# Patient Record
Sex: Female | Born: 2000 | Race: White | Hispanic: No | Marital: Single | Smoking: Never smoker
Health system: Southern US, Community
[De-identification: ages and names within clinical notes are randomized; demographics above are authoritative.]

## PROBLEM LIST (undated history)

## (undated) HISTORY — PX: WISDOM TOOTH EXTRACTION: SHX21

---
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
## 2022-01-23 IMAGING — MR MR FOOT*R* W/O CM
4 of 5 series · 13 of 40 positions shown · non-contrast
Comparison: Foot radiographs 11/05/2019

CLINICAL DATA: Chronic heel pain for 4 months. No known injury or
prior relevant surgery. Plantar fasciitis suspected.

EXAM:
MRI OF THE RIGHT HINDFOOT WITHOUT CONTRAST
TECHNIQUE: Multiplanar, multisequence MR imaging of the right ankle and
hindfoot was performed. No intravenous contrast was administered.

[Series 3: PD fat-sat · axial · right · 3.0mm · 0.25mm/px · z∈[-102,+9]mm · 4 of 34 slices shown]
[im 1/34]
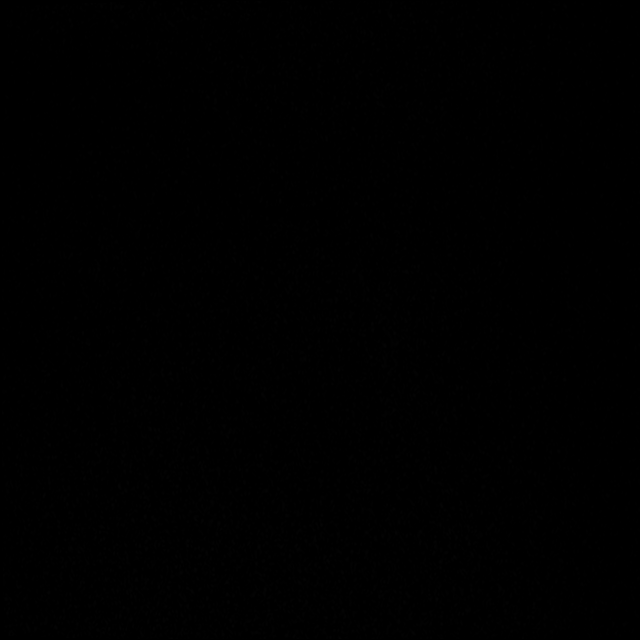
[im 5/34]
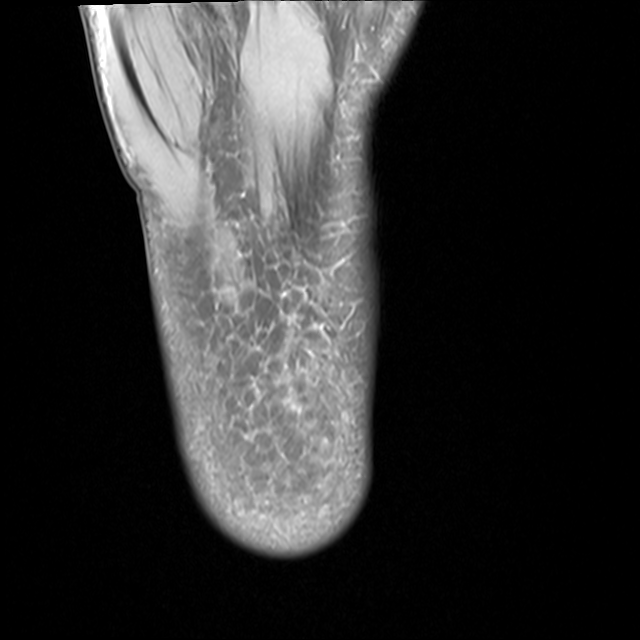
[im 17/34]
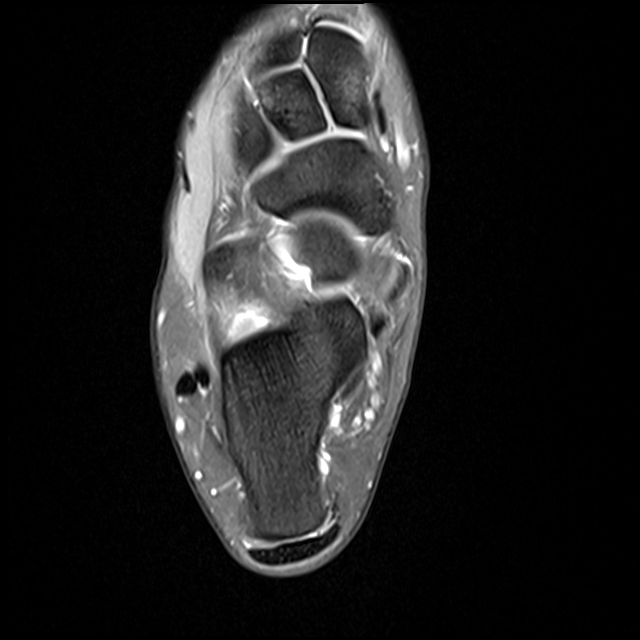
[im 29/34]
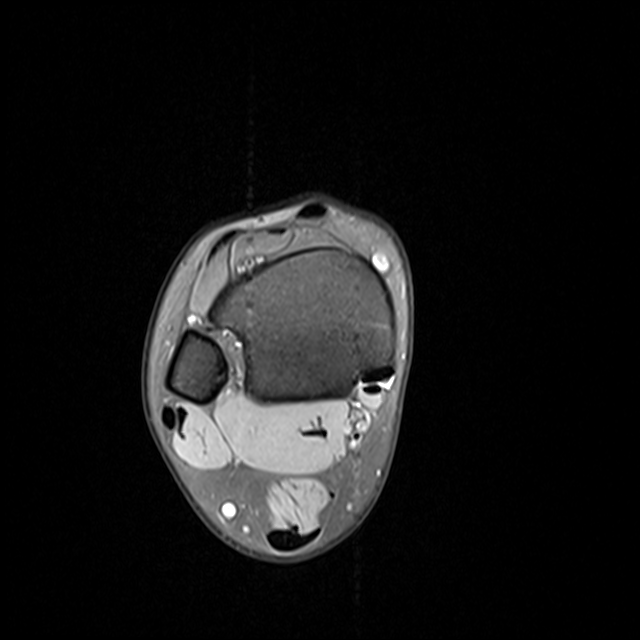

[Series 4: T2 fat-sat · axial · right · 3.0mm · 0.25mm/px · z∈[-86,+9]mm · 3 of 34 slices shown (1 of 2)]
[im 5/34]
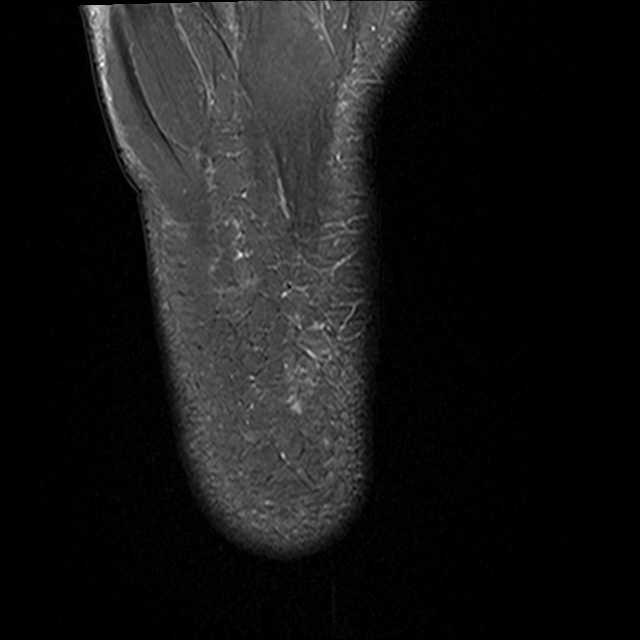
[im 17/34]
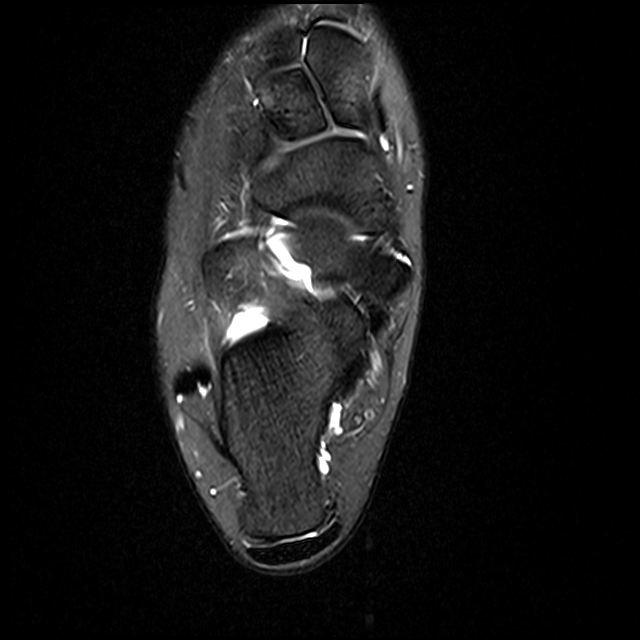
[im 29/34]
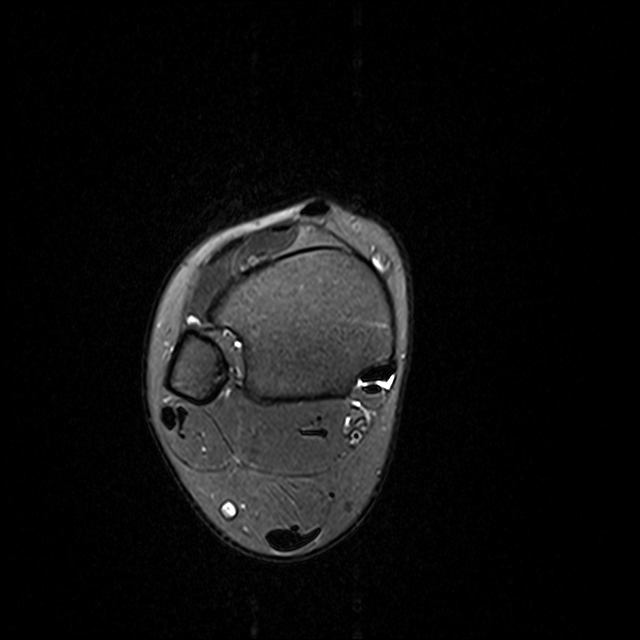

[Series 5: T1 · sagittal · right · 4.0mm · 0.27mm/px · 3 of 22 slices shown]
[im 5/22]
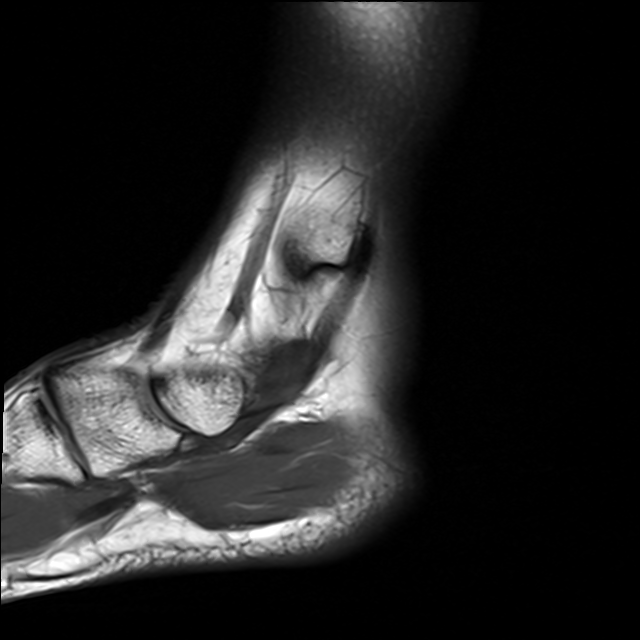
[im 13/22]
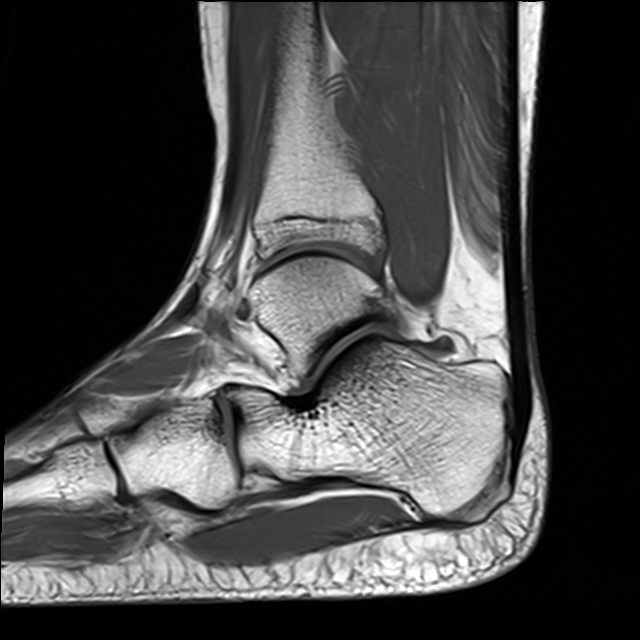
[im 22/22]
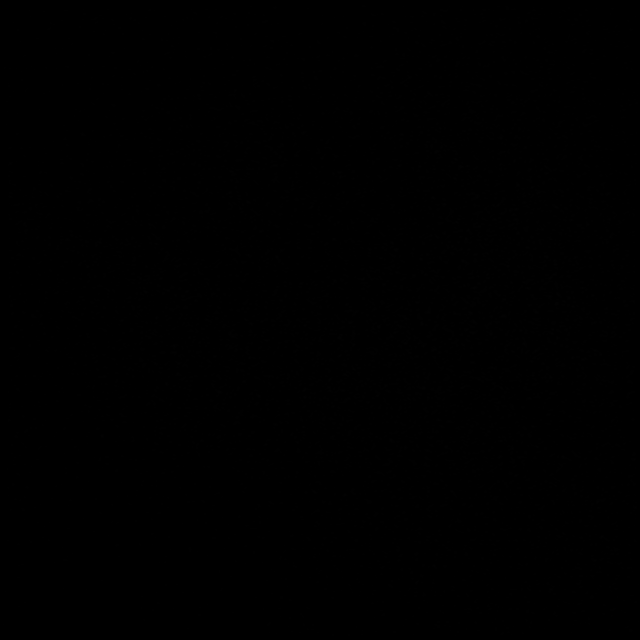

[Series 7: T2 fat-sat · coronal · right · 3.0mm · 0.25mm/px · 3 of 36 slices shown (2 of 2)]
[im 4/36]
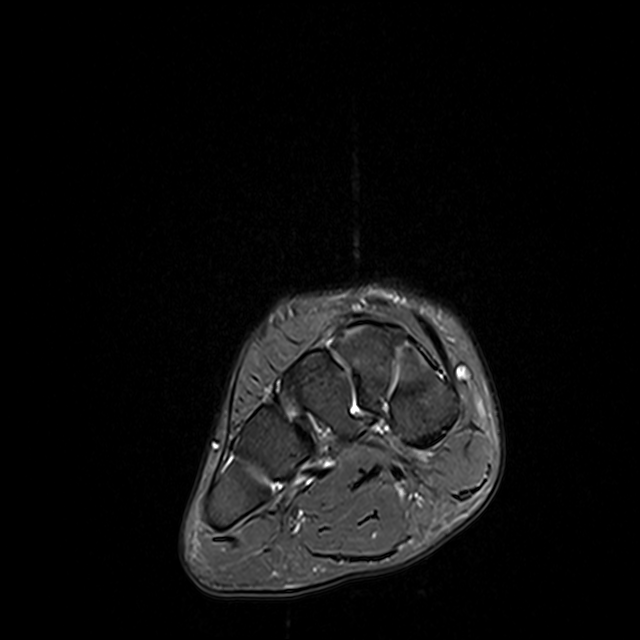
[im 20/36]
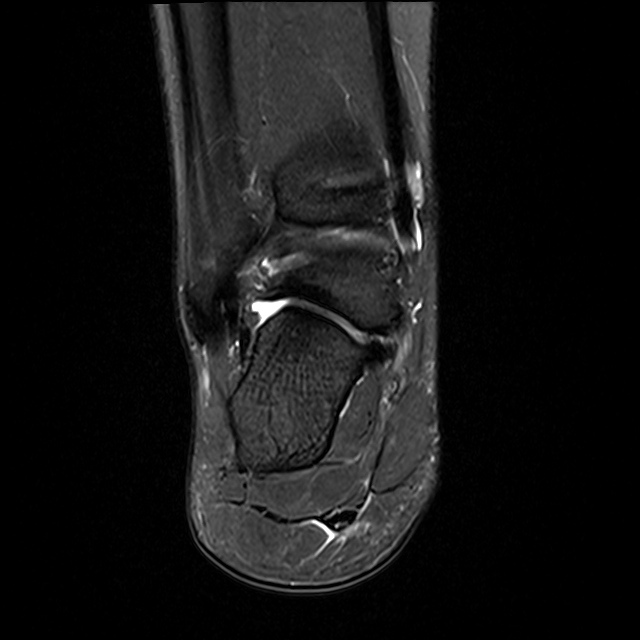
[im 32/36]
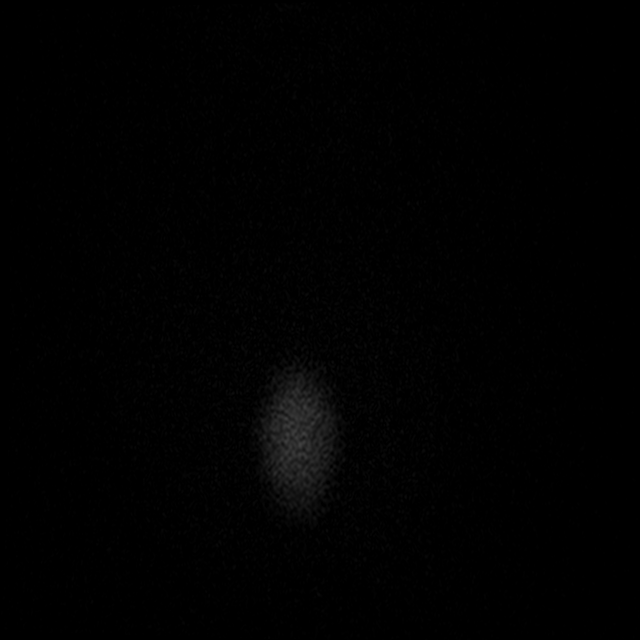

[13 of 40 positions shown; findings below may reference images not displayed]

FINDINGS: TENDONS

Peroneal: Intact and normally positioned.

Posteromedial: Intact and normally positioned.

Anterior: Intact and normally positioned.

Achilles: Intact.

Plantar Fascia: The central cord of the plantar fascia is thickened
with surrounding T2 hyperintensity. There is a probable small focus
of partial fascial tearing 1.5 cm distal to the calcaneal
attachment, best seen on sagittal image [DATE]. No fascial rupture or
retraction. No significant calcaneal spurring or reactive bone
marrow edema.

LIGAMENTS

Lateral: The anterior and posterior talofibular and calcaneofibular
ligaments are intact.

Medial: The deltoid and visualized portions of the spring ligament
appear intact.

CARTILAGE AND BONES

Ankle Joint: Ankle joint fluid within physiologic limits. The talar
dome and tibial plafond appear normal.

Subtalar Joints/Sinus Tarsi: Unremarkable.

Bones: No significant extra-articular osseous findings.

Other: No significant soft tissue findings.
IMPRESSION: 1. Findings consistent with plantar fasciitis with small focus of
partial fascial tearing 1.5 cm distal to the calcaneal attachment.
No fascial rupture or retraction.
2. The ankle tendons and ligaments appear normal.
3. No acute osseous findings or significant arthropathic changes.

## 2022-03-10 DIAGNOSIS — M7651 Patellar tendinitis, right knee: Secondary | ICD-10-CM | POA: Diagnosis not present

## 2022-04-19 DIAGNOSIS — Z0189 Encounter for other specified special examinations: Secondary | ICD-10-CM | POA: Diagnosis not present

## 2022-05-18 DIAGNOSIS — S86891A Other injury of other muscle(s) and tendon(s) at lower leg level, right leg, initial encounter: Secondary | ICD-10-CM | POA: Diagnosis not present

## 2022-05-19 DIAGNOSIS — S86891A Other injury of other muscle(s) and tendon(s) at lower leg level, right leg, initial encounter: Secondary | ICD-10-CM | POA: Diagnosis not present

## 2022-07-26 DIAGNOSIS — Z113 Encounter for screening for infections with a predominantly sexual mode of transmission: Secondary | ICD-10-CM | POA: Diagnosis not present

## 2022-07-26 DIAGNOSIS — N898 Other specified noninflammatory disorders of vagina: Secondary | ICD-10-CM | POA: Diagnosis not present

## 2022-09-01 DIAGNOSIS — Z23 Encounter for immunization: Secondary | ICD-10-CM | POA: Diagnosis not present

## 2022-09-25 DIAGNOSIS — M9906 Segmental and somatic dysfunction of lower extremity: Secondary | ICD-10-CM | POA: Diagnosis not present

## 2022-09-25 DIAGNOSIS — M9902 Segmental and somatic dysfunction of thoracic region: Secondary | ICD-10-CM | POA: Diagnosis not present

## 2022-09-25 DIAGNOSIS — M76821 Posterior tibial tendinitis, right leg: Secondary | ICD-10-CM | POA: Diagnosis not present

## 2022-09-25 DIAGNOSIS — M2140 Flat foot [pes planus] (acquired), unspecified foot: Secondary | ICD-10-CM | POA: Diagnosis not present

## 2022-09-25 DIAGNOSIS — M9905 Segmental and somatic dysfunction of pelvic region: Secondary | ICD-10-CM | POA: Diagnosis not present

## 2022-09-25 DIAGNOSIS — M9901 Segmental and somatic dysfunction of cervical region: Secondary | ICD-10-CM | POA: Diagnosis not present

## 2022-09-25 DIAGNOSIS — M9903 Segmental and somatic dysfunction of lumbar region: Secondary | ICD-10-CM | POA: Diagnosis not present

## 2022-09-29 DIAGNOSIS — M9903 Segmental and somatic dysfunction of lumbar region: Secondary | ICD-10-CM | POA: Diagnosis not present

## 2022-09-29 DIAGNOSIS — M76821 Posterior tibial tendinitis, right leg: Secondary | ICD-10-CM | POA: Diagnosis not present

## 2022-09-29 DIAGNOSIS — M9902 Segmental and somatic dysfunction of thoracic region: Secondary | ICD-10-CM | POA: Diagnosis not present

## 2022-09-29 DIAGNOSIS — M9905 Segmental and somatic dysfunction of pelvic region: Secondary | ICD-10-CM | POA: Diagnosis not present

## 2022-09-29 DIAGNOSIS — M2140 Flat foot [pes planus] (acquired), unspecified foot: Secondary | ICD-10-CM | POA: Diagnosis not present

## 2022-09-29 DIAGNOSIS — M9906 Segmental and somatic dysfunction of lower extremity: Secondary | ICD-10-CM | POA: Diagnosis not present

## 2022-09-29 DIAGNOSIS — M9901 Segmental and somatic dysfunction of cervical region: Secondary | ICD-10-CM | POA: Diagnosis not present

## 2022-10-02 DIAGNOSIS — Z0000 Encounter for general adult medical examination without abnormal findings: Secondary | ICD-10-CM | POA: Diagnosis not present

## 2022-10-02 DIAGNOSIS — Z131 Encounter for screening for diabetes mellitus: Secondary | ICD-10-CM | POA: Diagnosis not present

## 2022-10-02 DIAGNOSIS — M9903 Segmental and somatic dysfunction of lumbar region: Secondary | ICD-10-CM | POA: Diagnosis not present

## 2022-10-02 DIAGNOSIS — M2140 Flat foot [pes planus] (acquired), unspecified foot: Secondary | ICD-10-CM | POA: Diagnosis not present

## 2022-10-02 DIAGNOSIS — M9901 Segmental and somatic dysfunction of cervical region: Secondary | ICD-10-CM | POA: Diagnosis not present

## 2022-10-02 DIAGNOSIS — Z1330 Encounter for screening examination for mental health and behavioral disorders, unspecified: Secondary | ICD-10-CM | POA: Diagnosis not present

## 2022-10-02 DIAGNOSIS — M9902 Segmental and somatic dysfunction of thoracic region: Secondary | ICD-10-CM | POA: Diagnosis not present

## 2022-10-02 DIAGNOSIS — Z113 Encounter for screening for infections with a predominantly sexual mode of transmission: Secondary | ICD-10-CM | POA: Diagnosis not present

## 2022-10-02 DIAGNOSIS — E669 Obesity, unspecified: Secondary | ICD-10-CM | POA: Diagnosis not present

## 2022-10-02 DIAGNOSIS — M9905 Segmental and somatic dysfunction of pelvic region: Secondary | ICD-10-CM | POA: Diagnosis not present

## 2022-10-02 DIAGNOSIS — M9906 Segmental and somatic dysfunction of lower extremity: Secondary | ICD-10-CM | POA: Diagnosis not present

## 2022-10-02 DIAGNOSIS — M76821 Posterior tibial tendinitis, right leg: Secondary | ICD-10-CM | POA: Diagnosis not present

## 2022-10-02 DIAGNOSIS — Z23 Encounter for immunization: Secondary | ICD-10-CM | POA: Diagnosis not present

## 2022-10-09 ENCOUNTER — Ambulatory Visit (INDEPENDENT_AMBULATORY_CARE_PROVIDER_SITE_OTHER): Payer: Self-pay | Admitting: Podiatry

## 2022-10-09 ENCOUNTER — Encounter: Payer: Self-pay | Admitting: Podiatry

## 2022-10-09 DIAGNOSIS — M2141 Flat foot [pes planus] (acquired), right foot: Secondary | ICD-10-CM

## 2022-10-09 DIAGNOSIS — M722 Plantar fascial fibromatosis: Secondary | ICD-10-CM

## 2022-10-09 DIAGNOSIS — M2142 Flat foot [pes planus] (acquired), left foot: Secondary | ICD-10-CM

## 2022-10-09 DIAGNOSIS — L700 Acne vulgaris: Secondary | ICD-10-CM | POA: Insufficient documentation

## 2022-10-09 DIAGNOSIS — L739 Follicular disorder, unspecified: Secondary | ICD-10-CM | POA: Insufficient documentation

## 2022-10-09 NOTE — Progress Notes (Signed)
Subjective: Chief Complaint  Patient presents with   Foot Orthotics    History of PF - got orthotics, but now they are broken, she is a runner in college   22 year old female presents the office today with above concerns.  She states her orthotics are worn out and she was to get a new pair of inserts.  15 some shinsplints may be coming from the orthotics.  She has a history of stress fractures of the tibia but this feels more like shinsplints she reports.  Objective: AAO x3, NAD DP/PT pulses palpable bilaterally, CRT less than 3 seconds There is no.  Pinpoint tenderness noted to bilateral lower extremities.  There is no edema, erythema.  Flexor, extensor tendon appears intact.  There is decreased medial arch upon weightbearing.  MMT 5/5.  Assessment: Pes planovalgus with plantar fasciitis  Plan: -All treatment options discussed with the patient including all alternatives, risks, complications.  -She was measured for new orthotics today.  She does like the same style we will try to have the neck the same type of inserts that she has.  New molds were performed today. -Patient encouraged to call the office with any questions, concerns, change in symptoms.   Vivi Barrack DPM

## 2022-10-11 DIAGNOSIS — M9906 Segmental and somatic dysfunction of lower extremity: Secondary | ICD-10-CM | POA: Diagnosis not present

## 2022-10-11 DIAGNOSIS — M9905 Segmental and somatic dysfunction of pelvic region: Secondary | ICD-10-CM | POA: Diagnosis not present

## 2022-10-11 DIAGNOSIS — M9902 Segmental and somatic dysfunction of thoracic region: Secondary | ICD-10-CM | POA: Diagnosis not present

## 2022-10-11 DIAGNOSIS — M2140 Flat foot [pes planus] (acquired), unspecified foot: Secondary | ICD-10-CM | POA: Diagnosis not present

## 2022-10-11 DIAGNOSIS — M76821 Posterior tibial tendinitis, right leg: Secondary | ICD-10-CM | POA: Diagnosis not present

## 2022-10-11 DIAGNOSIS — M9901 Segmental and somatic dysfunction of cervical region: Secondary | ICD-10-CM | POA: Diagnosis not present

## 2022-10-11 DIAGNOSIS — M9903 Segmental and somatic dysfunction of lumbar region: Secondary | ICD-10-CM | POA: Diagnosis not present

## 2022-10-30 DIAGNOSIS — H60332 Swimmer's ear, left ear: Secondary | ICD-10-CM | POA: Diagnosis not present

## 2022-11-03 ENCOUNTER — Telehealth: Payer: Self-pay | Admitting: Podiatry

## 2022-11-03 NOTE — Telephone Encounter (Signed)
Lmom for pt to call back to schedule picking up orthotics    Balance is [phone]

## 2022-11-07 ENCOUNTER — Ambulatory Visit: Payer: Self-pay

## 2022-11-07 NOTE — Progress Notes (Signed)
Patient presents today to pick up custom molded foot orthotics, diagnosed with Pes Planus by Dr. Ardelle Anton.   Orthotics were dispensed and fit was satisfactory. Reviewed instructions for break-in and wear. Written instructions given to patient.  Patient will follow up as needed.   Martha Jimenez CPed, CFo, CFm

## 2022-11-17 DIAGNOSIS — Z0189 Encounter for other specified special examinations: Secondary | ICD-10-CM | POA: Diagnosis not present

## 2022-12-25 DIAGNOSIS — R300 Dysuria: Secondary | ICD-10-CM | POA: Diagnosis not present

## 2022-12-25 DIAGNOSIS — Z124 Encounter for screening for malignant neoplasm of cervix: Secondary | ICD-10-CM | POA: Diagnosis not present

## 2022-12-25 DIAGNOSIS — Z6831 Body mass index (BMI) 31.0-31.9, adult: Secondary | ICD-10-CM | POA: Diagnosis not present

## 2022-12-25 DIAGNOSIS — R351 Nocturia: Secondary | ICD-10-CM | POA: Diagnosis not present

## 2023-02-13 DIAGNOSIS — Z01419 Encounter for gynecological examination (general) (routine) without abnormal findings: Secondary | ICD-10-CM | POA: Diagnosis not present

## 2023-02-13 DIAGNOSIS — B001 Herpesviral vesicular dermatitis: Secondary | ICD-10-CM | POA: Diagnosis not present

## 2023-02-13 DIAGNOSIS — Z975 Presence of (intrauterine) contraceptive device: Secondary | ICD-10-CM | POA: Diagnosis not present

## 2023-02-21 DIAGNOSIS — Z6828 Body mass index (BMI) 28.0-28.9, adult: Secondary | ICD-10-CM | POA: Diagnosis not present

## 2023-02-21 DIAGNOSIS — F418 Other specified anxiety disorders: Secondary | ICD-10-CM | POA: Diagnosis not present

## 2023-02-21 DIAGNOSIS — Z01419 Encounter for gynecological examination (general) (routine) without abnormal findings: Secondary | ICD-10-CM | POA: Diagnosis not present

## 2023-02-21 DIAGNOSIS — R87611 Atypical squamous cells cannot exclude high grade squamous intraepithelial lesion on cytologic smear of cervix (ASC-H): Secondary | ICD-10-CM | POA: Diagnosis not present

## 2023-02-21 DIAGNOSIS — Z124 Encounter for screening for malignant neoplasm of cervix: Secondary | ICD-10-CM | POA: Diagnosis not present

## 2023-03-08 DIAGNOSIS — Z23 Encounter for immunization: Secondary | ICD-10-CM | POA: Diagnosis not present

## 2023-03-08 DIAGNOSIS — R87611 Atypical squamous cells cannot exclude high grade squamous intraepithelial lesion on cytologic smear of cervix (ASC-H): Secondary | ICD-10-CM | POA: Diagnosis not present

## 2023-03-08 DIAGNOSIS — Z3202 Encounter for pregnancy test, result negative: Secondary | ICD-10-CM | POA: Diagnosis not present

## 2023-04-18 DIAGNOSIS — M5116 Intervertebral disc disorders with radiculopathy, lumbar region: Secondary | ICD-10-CM | POA: Diagnosis not present

## 2023-04-18 DIAGNOSIS — M48061 Spinal stenosis, lumbar region without neurogenic claudication: Secondary | ICD-10-CM | POA: Diagnosis not present

## 2023-04-18 DIAGNOSIS — M5459 Other low back pain: Secondary | ICD-10-CM | POA: Diagnosis not present

## 2023-04-27 DIAGNOSIS — M549 Dorsalgia, unspecified: Secondary | ICD-10-CM | POA: Diagnosis not present

## 2023-04-27 DIAGNOSIS — Z1331 Encounter for screening for depression: Secondary | ICD-10-CM | POA: Diagnosis not present

## 2023-04-27 DIAGNOSIS — M5416 Radiculopathy, lumbar region: Secondary | ICD-10-CM | POA: Diagnosis not present

## 2023-04-27 DIAGNOSIS — M5126 Other intervertebral disc displacement, lumbar region: Secondary | ICD-10-CM | POA: Diagnosis not present

## 2023-04-27 DIAGNOSIS — Z6832 Body mass index (BMI) 32.0-32.9, adult: Secondary | ICD-10-CM | POA: Diagnosis not present

## 2023-04-28 DIAGNOSIS — M5126 Other intervertebral disc displacement, lumbar region: Secondary | ICD-10-CM | POA: Diagnosis not present

## 2023-04-28 DIAGNOSIS — M5416 Radiculopathy, lumbar region: Secondary | ICD-10-CM | POA: Diagnosis not present

## 2023-04-29 DIAGNOSIS — M5416 Radiculopathy, lumbar region: Secondary | ICD-10-CM | POA: Diagnosis not present

## 2023-04-29 DIAGNOSIS — M5126 Other intervertebral disc displacement, lumbar region: Secondary | ICD-10-CM | POA: Diagnosis not present

## 2023-05-28 DIAGNOSIS — M5416 Radiculopathy, lumbar region: Secondary | ICD-10-CM | POA: Diagnosis not present

## 2023-05-28 DIAGNOSIS — M5126 Other intervertebral disc displacement, lumbar region: Secondary | ICD-10-CM | POA: Diagnosis not present

## 2023-06-24 DIAGNOSIS — Z113 Encounter for screening for infections with a predominantly sexual mode of transmission: Secondary | ICD-10-CM | POA: Diagnosis not present

## 2023-06-24 DIAGNOSIS — R300 Dysuria: Secondary | ICD-10-CM | POA: Diagnosis not present

## 2023-06-24 DIAGNOSIS — Z8619 Personal history of other infectious and parasitic diseases: Secondary | ICD-10-CM | POA: Diagnosis not present

## 2023-06-24 DIAGNOSIS — N3001 Acute cystitis with hematuria: Secondary | ICD-10-CM | POA: Diagnosis not present

## 2023-06-24 DIAGNOSIS — M5126 Other intervertebral disc displacement, lumbar region: Secondary | ICD-10-CM | POA: Diagnosis not present

## 2023-06-24 DIAGNOSIS — M5416 Radiculopathy, lumbar region: Secondary | ICD-10-CM | POA: Diagnosis not present

## 2023-07-26 DIAGNOSIS — M5416 Radiculopathy, lumbar region: Secondary | ICD-10-CM | POA: Diagnosis not present

## 2023-07-26 DIAGNOSIS — M5126 Other intervertebral disc displacement, lumbar region: Secondary | ICD-10-CM | POA: Diagnosis not present

## 2023-08-10 DIAGNOSIS — L700 Acne vulgaris: Secondary | ICD-10-CM | POA: Diagnosis not present

## 2023-08-10 DIAGNOSIS — L739 Follicular disorder, unspecified: Secondary | ICD-10-CM | POA: Diagnosis not present

## 2023-08-25 DIAGNOSIS — M5126 Other intervertebral disc displacement, lumbar region: Secondary | ICD-10-CM | POA: Diagnosis not present

## 2023-08-25 DIAGNOSIS — M5416 Radiculopathy, lumbar region: Secondary | ICD-10-CM | POA: Diagnosis not present

## 2023-09-25 DIAGNOSIS — M5126 Other intervertebral disc displacement, lumbar region: Secondary | ICD-10-CM | POA: Diagnosis not present

## 2023-09-25 DIAGNOSIS — M5416 Radiculopathy, lumbar region: Secondary | ICD-10-CM | POA: Diagnosis not present

## 2023-10-04 DIAGNOSIS — Z30432 Encounter for removal of intrauterine contraceptive device: Secondary | ICD-10-CM | POA: Diagnosis not present

## 2023-10-04 DIAGNOSIS — R87610 Atypical squamous cells of undetermined significance on cytologic smear of cervix (ASC-US): Secondary | ICD-10-CM | POA: Diagnosis not present

## 2023-10-25 DIAGNOSIS — M5126 Other intervertebral disc displacement, lumbar region: Secondary | ICD-10-CM | POA: Diagnosis not present

## 2023-10-25 DIAGNOSIS — M5416 Radiculopathy, lumbar region: Secondary | ICD-10-CM | POA: Diagnosis not present

## 2023-11-15 DIAGNOSIS — Z01419 Encounter for gynecological examination (general) (routine) without abnormal findings: Secondary | ICD-10-CM | POA: Diagnosis not present

## 2023-11-15 DIAGNOSIS — Z113 Encounter for screening for infections with a predominantly sexual mode of transmission: Secondary | ICD-10-CM | POA: Diagnosis not present

## 2023-11-25 DIAGNOSIS — M5416 Radiculopathy, lumbar region: Secondary | ICD-10-CM | POA: Diagnosis not present

## 2023-11-25 DIAGNOSIS — M5126 Other intervertebral disc displacement, lumbar region: Secondary | ICD-10-CM | POA: Diagnosis not present

## 2023-12-26 DIAGNOSIS — M5126 Other intervertebral disc displacement, lumbar region: Secondary | ICD-10-CM | POA: Diagnosis not present

## 2023-12-26 DIAGNOSIS — M5416 Radiculopathy, lumbar region: Secondary | ICD-10-CM | POA: Diagnosis not present

## 2024-01-12 DIAGNOSIS — N390 Urinary tract infection, site not specified: Secondary | ICD-10-CM | POA: Diagnosis not present

## 2024-01-25 DIAGNOSIS — M5126 Other intervertebral disc displacement, lumbar region: Secondary | ICD-10-CM | POA: Diagnosis not present

## 2024-01-25 DIAGNOSIS — M5416 Radiculopathy, lumbar region: Secondary | ICD-10-CM | POA: Diagnosis not present
